# Patient Record
Sex: Female | Born: 1935 | Race: White | Hispanic: No | Marital: Married | Smoking: Never smoker
Health system: Southern US, Community
[De-identification: ages and names within clinical notes are randomized; demographics above are authoritative.]

## PROBLEM LIST (undated history)

## (undated) DIAGNOSIS — K219 Gastro-esophageal reflux disease without esophagitis: Secondary | ICD-10-CM

## (undated) DIAGNOSIS — K449 Diaphragmatic hernia without obstruction or gangrene: Secondary | ICD-10-CM

## (undated) DIAGNOSIS — S8290XA Unspecified fracture of unspecified lower leg, initial encounter for closed fracture: Secondary | ICD-10-CM

## (undated) HISTORY — PX: LEG SURGERY: SHX1003

## (undated) HISTORY — PX: OTHER SURGICAL HISTORY: SHX169

---

## 2008-03-21 ENCOUNTER — Emergency Department (HOSPITAL_COMMUNITY): Admission: EM | Admit: 2008-03-21 | Discharge: 2008-03-22 | Payer: Self-pay | Admitting: Emergency Medicine

## 2011-03-14 LAB — POCT I-STAT, CHEM 8
Chloride: 110
Creatinine, Ser: 1.3 — ABNORMAL HIGH
Glucose, Bld: 133 — ABNORMAL HIGH
HCT: 67 — ABNORMAL HIGH
Hemoglobin: 22.8 — ABNORMAL HIGH
Potassium: 4
Sodium: 143
TCO2: 20

## 2015-04-14 ENCOUNTER — Emergency Department (INDEPENDENT_AMBULATORY_CARE_PROVIDER_SITE_OTHER)
Admission: EM | Admit: 2015-04-14 | Discharge: 2015-04-14 | Disposition: A | Discharge status: Home / Self Care | Payer: Medicare Other | Source: Home / Self Care

## 2015-04-14 ENCOUNTER — Encounter (HOSPITAL_COMMUNITY): Payer: Self-pay | Admitting: *Deleted

## 2015-04-14 DIAGNOSIS — R059 Cough, unspecified: Secondary | ICD-10-CM

## 2015-04-14 DIAGNOSIS — R05 Cough: Secondary | ICD-10-CM

## 2015-04-14 HISTORY — DX: Gastro-esophageal reflux disease without esophagitis: K21.9

## 2015-04-14 NOTE — ED Notes (Signed)
Called no answer

## 2015-04-14 NOTE — ED Provider Notes (Signed)
CSN: [id number]     Arrival date & time 04/14/15  1343 History   None    Chief Complaint  Patient presents with  . URI   (Consider location/radiation/quality/duration/timing/severity/associated sxs/prior Treatment) HPI History obtained from patient:   LOCATION: chest SEVERITY:0 DURATION:this morning CONTEXT:episode of cough this morning after drinking coffee QUALITY: wet cough MODIFYING FACTORS: none, just stopped ASSOCIATED SYMPTOMS: wheezing TIMING:resolved  No past medical history on file. No past surgical history on file. No family history on file. Social History  Substance Use Topics  . Smoking status: Not on file  . Smokeless tobacco: Not on file  . Alcohol Use: Not on file   OB History    No data available     Review of Systems ROS +'ve cough  Denies: HEADACHE, NAUSEA, ABDOMINAL PAIN, CHEST PAIN, CONGESTION, DYSURIA, SHORTNESS OF BREATH  Allergies  Review of patient's allergies indicates not on file.  Home Medications   Prior to Admission medications   Not on File   Meds Ordered and Administered this Visit  Medications - No data to display  There were no vitals taken for this visit. No data found.   Physical Exam  Constitutional: She is oriented to person, place, and time. She appears well-developed and well-nourished. No distress.  HENT:  Head: Normocephalic and atraumatic.  Cardiovascular: Normal rate.   Pulmonary/Chest: Effort normal and breath sounds normal. No respiratory distress. She has no wheezes. She has no rales.  Musculoskeletal: Normal range of motion.  Neurological: She is alert and oriented to person, place, and time.  Skin: Skin is warm and dry.  Psychiatric: She has a normal mood and affect. Her behavior is normal. Judgment and thought content normal.    ED Course  Procedures (including critical care time)  Labs Review Labs Reviewed - No data to display  Imaging Review No results found.   Visual Acuity Review  Right  Eye Distance:   Left Eye Distance:   Bilateral Distance:    Right Eye Near:   Left Eye Near:    Bilateral Near:         MDM   1. Cough     No indications for investigations at this time. Treatment options discussed including: xray  Pt is stable for discharge at this time.  Continue symptomatic treatment at home. Advised to follow up with PCP or return to the clinic if there are new or worsening of symptoms.  All questions answered.  Instructions of care provided.   THIS NOTE WAS GENERATED USING A VOICE RECOGNITION SOFTWARE PROGRAM.  ALL REASONABLE EFFORTS WERE MADE TO PROOFREAD THIS DOCUMENT FOR ACCURACY.      Tharon AquasFrank C Patrick, PA 04/14/15 1604  Tharon AquasFrank C Patrick, [place] 04/15/15 [phone]

## 2015-04-14 NOTE — ED Notes (Signed)
Pt  Has  Symptoms  Of   Cough   Congested        Rattling  In  Her  Chest   With  Onset  Of  Symptoms  For  One  Day             Pt  Is  Sitting    Upright  On the   Exam table      [place]     [place]

## 2015-04-14 NOTE — Discharge Instructions (Signed)
Cough, Adult Your lungs are clear, there are no signs of infection at this time.  A cough helps to clear your throat and lungs. A cough may last only 2-3 weeks (acute), or it may last longer than 8 weeks (chronic). Many different things can cause a cough. A cough may be a sign of an illness or another medical condition. HOME CARE  Pay attention to any changes in your cough.  Take medicines only as told by your doctor.  If you were prescribed an antibiotic medicine, take it as told by your doctor. Do not stop taking it even if you start to feel better.  Talk with your doctor before you try using a cough medicine.  Drink enough fluid to keep your pee (urine) clear or pale yellow.  If the air is dry, use a cold steam vaporizer or humidifier in your home.  Stay away from things that make you cough at work or at home.  If your cough is worse at night, try using extra pillows to raise your head up higher while you sleep.  Do not smoke, and try not to be around smoke. If you need help quitting, ask your doctor.  Do not have caffeine.  Do not drink alcohol.  Rest as needed. GET HELP IF:  You have new problems (symptoms).  You cough up yellow fluid (pus).  Your cough does not get better after 2-3 weeks, or your cough gets worse.  Medicine does not help your cough and you are not sleeping well.  You have pain that gets worse or pain that is not helped with medicine.  You have a fever.  You are losing weight and you do not know why.  You have night sweats. GET HELP RIGHT AWAY IF:  You cough up blood.  You have trouble breathing.  Your heartbeat is very fast.   This information is not intended to replace advice given to you by your health care provider. Make sure you discuss any questions you have with your health care provider.   Document Released: 02/09/2011 Document Revised: 02/17/2015 Document Reviewed: 08/05/2014 Elsevier Interactive Patient Education [employer]2016 Elsevier  Inc.

## 2016-12-14 ENCOUNTER — Ambulatory Visit (HOSPITAL_COMMUNITY)
Admission: EM | Admit: 2016-12-14 | Discharge: 2016-12-14 | Disposition: A | Discharge status: Home / Self Care | Payer: Medicare Other | Attending: Family Medicine | Admitting: Family Medicine

## 2016-12-14 ENCOUNTER — Encounter (HOSPITAL_COMMUNITY): Payer: Self-pay | Admitting: Emergency Medicine

## 2016-12-14 DIAGNOSIS — M25571 Pain in right ankle and joints of right foot: Secondary | ICD-10-CM | POA: Diagnosis not present

## 2016-12-14 HISTORY — DX: Unspecified fracture of unspecified lower leg, initial encounter for closed fracture: S82.90XA

## 2016-12-14 HISTORY — DX: Diaphragmatic hernia without obstruction or gangrene: K44.9

## 2016-12-14 LAB — CBC WITH DIFFERENTIAL/PLATELET
BASOS ABS: 0 10*3/uL (ref 0.0–0.1)
Basophils Relative: 1 %
Eosinophils Absolute: 0.1 10*3/uL (ref 0.0–0.7)
Eosinophils Relative: 2 %
HEMATOCRIT: 37.5 % (ref 36.0–46.0)
HEMOGLOBIN: 11.6 g/dL — AB (ref 12.0–15.0)
LYMPHS PCT: 30 %
Lymphs Abs: 1.9 10*3/uL (ref 0.7–4.0)
MCH: 24.9 pg — ABNORMAL LOW (ref 26.0–34.0)
MCHC: 30.9 g/dL (ref 30.0–36.0)
MCV: 80.5 fL (ref 78.0–100.0)
MONO ABS: 0.5 10*3/uL (ref 0.1–1.0)
Monocytes Relative: 8 %
NEUTROS ABS: 3.6 10*3/uL (ref 1.7–7.7)
NEUTROS PCT: 59 %
Platelets: 349 10*3/uL (ref 150–400)
RBC: 4.66 MIL/uL (ref 3.87–5.11)
RDW: 20.7 % — AB (ref 11.5–15.5)
WBC: 6.1 10*3/uL (ref 4.0–10.5)

## 2016-12-14 LAB — URIC ACID: Uric Acid, Serum: 4.9 mg/dL (ref 2.3–6.6)

## 2016-12-14 LAB — C-REACTIVE PROTEIN: CRP: 1.4 mg/dL — ABNORMAL HIGH (ref <1.0)

## 2016-12-14 MED ORDER — PREDNISONE 10 MG PO TABS: 20.0000 mg | ORAL_TABLET | Freq: Every day | ORAL | 0 refills | Status: DC

## 2016-12-14 MED ORDER — CEPHALEXIN 500 MG PO CAPS: 500.0000 mg | ORAL_CAPSULE | Freq: Four times a day (QID) | ORAL | 0 refills | Status: DC

## 2016-12-14 NOTE — ED Triage Notes (Signed)
Monday morning woke with soreness, redness and swelling.  Symptoms have worsened.  No known injury.

## 2016-12-14 NOTE — ED Provider Notes (Signed)
CSN: [id number]     Arrival date & time 12/14/16  [id number] History   First MD Initiated Contact with Patient 12/14/16 1015     Chief Complaint  Patient presents with  . Ankle Pain   (Consider location/radiation/quality/duration/timing/severity/associated sxs/prior Treatment) 83 year old female with pain to the lateral aspect of the right ankle. She awoke 3 days ago and experienced discomfort. She noticed that there was some erythema followed by swelling to the lateral aspect. The initial erythema was just superior to the lateral malleolus in this area is also the most tender. Erythema has extended over the malleolus and distended to cover the lateral aspect of the ankle. There is some pain with weightbearing but she has full range of motion. Does not recollect any sort of injury, fall, blunt trauma or other. Denies recognizing any sort of sting or insect bite. No history of gout.      Past Medical History:  Diagnosis Date  . GERD (gastroesophageal reflux disease)   . Hiatal hernia   . Leg fracture    Past Surgical History:  Procedure Laterality Date  . hiatal hernia surgery    . LEG SURGERY     No family history on file. Social History  Substance Use Topics  . Smoking status: Never Smoker  . Smokeless tobacco: Not on file  . Alcohol use No   OB History    No data available     Review of Systems  Constitutional: Negative.  Negative for fever.  HENT: Negative.   Respiratory: Negative.   Gastrointestinal: Negative.   Genitourinary: Negative.   Musculoskeletal: Negative.   Skin: Positive for color change.  Neurological: Negative.   Psychiatric/Behavioral: Negative.   All other systems reviewed and are negative.   Allergies  Sulfa antibiotics  Home Medications   Prior to Admission medications   Medication Sig Start Date End Date Taking? Authorizing Provider  cephALEXin (KEFLEX) 500 MG capsule Take 1 capsule (500 mg total) by mouth 4 (four) times daily. 12/14/16   Hayden RasmussenMabe,  Ricci Dirocco, NP  predniSONE (DELTASONE) 10 MG tablet Take 2 tablets (20 mg total) by mouth daily. Take with food 12/14/16   Hayden RasmussenMabe, Leonell Lobdell, NP   Meds Ordered and Administered this Visit  Medications - No data to display  BP (!) 190/80 (BP Location: Right Arm) Comment: reported elevated BP to nurse Kim Lapan-Hutchens  Pulse 73   Temp 98.3 F (36.8 C) (Oral)   Resp 18   SpO2 96%  No data found.   Physical Exam  Constitutional: She is oriented to person, place, and time. She appears well-developed and well-nourished.  Neck: Neck supple.  Cardiovascular: Normal rate.   Pulmonary/Chest: Effort normal.  Musculoskeletal: Normal range of motion. She exhibits edema and tenderness.  Greatest amount of tenderness and slightly deeper erythema and an oval pattern just superior to the lateral malleolus. There is some swelling over the lateral malleolus and also tender to deeper palpation. Erythema extends to cover most of the lateral aspect of the ankle. The foot does not appear to be affected. Distal neurovascular motor sensory is grossly intact.  Neurological: She is alert and oriented to person, place, and time. No cranial nerve deficit.  Skin: Skin is warm.  No generalized symptoms. No rash, erythema or swelling elsewhere.  Psychiatric: She has a normal mood and affect.  Nursing note and vitals reviewed.   Urgent Care Course     Procedures (including critical care time)  Labs Review Labs Reviewed  CBC WITH DIFFERENTIAL/PLATELET  URIC  ACID  C-REACTIVE PROTEIN    Imaging Review No results found.   Visual Acuity Review  Right Eye Distance:   Left Eye Distance:   Bilateral Distance:    Right Eye Near:   Left Eye Near:    Bilateral Near:         MDM   1. Acute right ankle pain    The specific reason for the redness and tenderness in the ankle is unknown. We are drawing blood to assist in finding out what might be causing this particular pain. Possibilities include infection,  inflammation whether it be an insect bite or something from the inside, gout or something else. If the blood comes back in suggest something that you need to be treated for that the medicines given you today will not help he will be called and advised of the findings and possible change in medication. Try to keep elevated and he may want to apply some cool compresses if it is itching. If cool does not help then try heat. For any worsening, new symptoms or problems seek medical attention promptly. If the redness and pain is swelling/worsening or forming red streaks you may need to go to the emergency department.  Labs pending Meds ordered this encounter  Medications  . cephALEXin (KEFLEX) 500 MG capsule    Sig: Take 1 capsule (500 mg total) by mouth 4 (four) times daily.    Dispense:  28 capsule    Refill:  0    Order Specific Question:   Supervising Provider    Answer:   Mardella Layman [id number]  . predniSONE (DELTASONE) 10 MG tablet    Sig: Take 2 tablets (20 mg total) by mouth daily. Take with food    Dispense:  14 tablet    Refill:  0    Order Specific Question:   Supervising Provider    Answer:   Mardella Layman [[id number]]      Hayden Rasmussen, NP 12/14/16 1047

## 2016-12-14 NOTE — Discharge Instructions (Signed)
The specific reason for the redness and tenderness in the ankle is unknown. We are drawing blood to assist in finding out what might be causing this particular pain. Possibilities include infection, inflammation whether it be an insect bite or something from the inside, gout or something else. If the blood comes back in suggest something that you need to be treated for that the medicines given you today will not help he will be called and advised of the findings and possible change in medication. Try to keep elevated and he may want to apply some cool compresses if it is itching. If cool does not help then try heat. For any worsening, new symptoms or problems seek medical attention promptly. If the redness and pain is swelling/worsening or forming red streaks you may need to go to the emergency department.

## 2016-12-19 ENCOUNTER — Encounter (HOSPITAL_COMMUNITY): Payer: Self-pay | Admitting: Emergency Medicine

## 2016-12-19 ENCOUNTER — Ambulatory Visit (HOSPITAL_COMMUNITY)
Admission: EM | Admit: 2016-12-19 | Discharge: 2016-12-19 | Disposition: A | Discharge status: Home / Self Care | Payer: Medicare Other | Attending: Family Medicine | Admitting: Family Medicine

## 2016-12-19 ENCOUNTER — Ambulatory Visit (INDEPENDENT_AMBULATORY_CARE_PROVIDER_SITE_OTHER): Payer: Medicare Other

## 2016-12-19 DIAGNOSIS — S82831A Other fracture of upper and lower end of right fibula, initial encounter for closed fracture: Secondary | ICD-10-CM | POA: Diagnosis not present

## 2016-12-19 NOTE — ED Triage Notes (Signed)
The patient [facility] with a complaint of continued right ankle pain after a visit on 12/14/2016. The patient stated that she was diagnosed with gout and prescribed medication which she has taken.

## 2016-12-19 NOTE — ED Provider Notes (Addendum)
CSN: [id number]     Arrival date & time 12/19/16  1004 History   None    Chief Complaint  Patient presents with  . Follow-up   (Consider location/radiation/quality/duration/timing/severity/associated sxs/prior Treatment) Pt complains of continued ankle pain Pt has been taking prednisone and antibiotic and ankle is still swollen   The history is provided by the patient. No language interpreter was used.  Ankle Pain  Location:  Ankle Time since incident:  1 week Injury: no   Ankle location:  R ankle Pain details:    Quality:  Aching   Radiates to:  Does not radiate   Severity:  Moderate   Onset quality:  Gradual   Duration:  1 week   Timing:  Constant   Progression:  Worsening Chronicity:  New Dislocation: no   Foreign body present:  No foreign bodies Tetanus status:  Unknown Prior injury to area:  No Relieved by:  Nothing Worsened by:  Nothing Ineffective treatments:  None tried Associated symptoms: swelling   Risk factors: no concern for non-accidental trauma     Past Medical History:  Diagnosis Date  . GERD (gastroesophageal reflux disease)   . Hiatal hernia   . Leg fracture    Past Surgical History:  Procedure Laterality Date  . hiatal hernia surgery    . LEG SURGERY     History reviewed. No pertinent family history. Social History  Substance Use Topics  . Smoking status: Never Smoker  . Smokeless tobacco: Not on file  . Alcohol use No   OB History    No data available     Review of Systems  All other systems reviewed and are negative.   Allergies  Sulfa antibiotics  Home Medications   Prior to Admission medications   Medication Sig Start Date End Date Taking? Authorizing Provider  cephALEXin (KEFLEX) 500 MG capsule Take 1 capsule (500 mg total) by mouth 4 (four) times daily. 12/14/16  Yes Mabe, Onalee Huaavid, NP  predniSONE (DELTASONE) 10 MG tablet Take 2 tablets (20 mg total) by mouth daily. Take with food 12/14/16  Yes Mabe, Onalee Huaavid, NP   Meds Ordered  and Administered this Visit  Medications - No data to display  BP (!) 163/73 (BP Location: Right Arm)   Pulse (!) 59   Temp 98 F (36.7 C) (Oral)   Resp 18   SpO2 99%  No data found.   Physical Exam  Constitutional: She appears well-developed and well-nourished.  HENT:  Head: Normocephalic.  Musculoskeletal: She exhibits tenderness.  Swollen tender right ankle, pain with range of motion,  nv and ns intact   Neurological: She is alert.  Skin: Skin is warm.  Psychiatric: She has a normal mood and affect.  Nursing note and vitals reviewed.   Urgent Care Course     Procedures (including critical care time)  Labs Review Labs Reviewed - No data to display  Imaging Review Dg Ankle Complete Right  Result Date: 12/19/2016 CLINICAL DATA:  Right ankle pain. EXAM: RIGHT ANKLE - COMPLETE 3+ VIEW COMPARISON:  None. FINDINGS: Mild lateral soft tissue swelling. Cortical irregularity noted in the distal fibular metadiaphysis concerning for nondisplaced distal fibular fracture. No tibial abnormality. IMPRESSION: Concern for nondisplaced distal fibular metadiaphyseal fracture. Electronically Signed   By: Charlett NoseKevin  Dover M.D.   On: 12/19/2016 11:46     Visual Acuity Review  Right Eye Distance:   Left Eye Distance:   Bilateral Distance:    Right Eye Near:   Left Eye Near:  Bilateral Near:         MDM xray shows distal fibula fracture.   Pt counseled on results.  Pt advised to stop prednisone and antibiotic.  Pt is visiting from [facility].  Pt has a foot doctor in [place].  She will follow up for recheck in 1 week   1. Other closed fracture of distal end of right fibula, initial encounter    An After Visit Summary was printed and given to the patient.     Elson Areas, PA-C 12/19/16 1303    Elson Areas, [place] 12/19/16 1303

## 2016-12-19 NOTE — ED Notes (Signed)
Hal NeerLivia S., EMT applying cam walker

## 2020-12-02 DIAGNOSIS — M546 Pain in thoracic spine: Secondary | ICD-10-CM | POA: Insufficient documentation

## 2020-12-02 DIAGNOSIS — X501XXA Overexertion from prolonged static or awkward postures, initial encounter: Secondary | ICD-10-CM | POA: Diagnosis not present

## 2020-12-02 DIAGNOSIS — Y990 Civilian activity done for income or pay: Secondary | ICD-10-CM | POA: Diagnosis not present

## 2020-12-02 DIAGNOSIS — Y92009 Unspecified place in unspecified non-institutional (private) residence as the place of occurrence of the external cause: Secondary | ICD-10-CM | POA: Diagnosis not present

## 2020-12-02 DIAGNOSIS — Y9389 Activity, other specified: Secondary | ICD-10-CM | POA: Diagnosis not present

## 2020-12-03 ENCOUNTER — Other Ambulatory Visit: Payer: Self-pay

## 2020-12-03 ENCOUNTER — Emergency Department (HOSPITAL_COMMUNITY)
Admission: EM | Admit: 2020-12-03 | Discharge: 2020-12-03 | Disposition: A | Discharge status: Home / Self Care | Payer: Medicare Other | Attending: Emergency Medicine | Admitting: Emergency Medicine

## 2020-12-03 ENCOUNTER — Emergency Department (HOSPITAL_COMMUNITY): Payer: Medicare Other

## 2020-12-03 ENCOUNTER — Encounter (HOSPITAL_COMMUNITY): Payer: Self-pay

## 2020-12-03 DIAGNOSIS — M546 Pain in thoracic spine: Secondary | ICD-10-CM | POA: Diagnosis not present

## 2020-12-03 DIAGNOSIS — M549 Dorsalgia, unspecified: Secondary | ICD-10-CM

## 2020-12-03 NOTE — ED Provider Notes (Signed)
[facility] EMERGENCY DEPARTMENT Provider Note   CSN: [id number] Arrival date & time: 12/02/20  2319     History Chief Complaint  Patient presents with   Back Pain    Jill Wise is a 87 y.o. female.  The history is provided by the patient.  Back Pain She states that she bent over to put something in the dishwasher when she stood up, she had severe pain in her mid back.  Pain is primarily there if she tries to move.  Pain is rated at 6/10.  She denies any weakness, numbness, tingling.  Pain is similar to what she had with a broken rib in the past.  She has not taken anything for her pain.  She denies other injury.   Past Medical History:  Diagnosis Date   GERD (gastroesophageal reflux disease)    Hiatal hernia    Leg fracture     There are no problems to display for this patient.   Past Surgical History:  Procedure Laterality Date   hiatal hernia surgery     LEG SURGERY       OB History   No obstetric history on file.     History reviewed. No pertinent family history.  Social History   Tobacco Use   Smoking status: Never  Substance Use Topics   Alcohol use: No    Home Medications Prior to Admission medications   Medication Sig Start Date End Date Taking? Authorizing Provider  cephALEXin (KEFLEX) 500 MG capsule Take 1 capsule (500 mg total) by mouth 4 (four) times daily. 12/14/16   Hayden Rasmussen, NP  predniSONE (DELTASONE) 10 MG tablet Take 2 tablets (20 mg total) by mouth daily. Take with food 12/14/16   Hayden Rasmussen, NP    Allergies    Sulfa antibiotics  Review of Systems   Review of Systems  Musculoskeletal:  Positive for back pain.  All other systems reviewed and are negative.  Physical Exam Updated Vital Signs BP (!) 141/71   Pulse 62   Temp 98.7 F (37.1 C) (Oral)   Resp 18   Ht 5\' 2"  (1.575 m)   Wt 61.7 kg   SpO2 95%   BMI 24.87 kg/m   Physical Exam Vitals and nursing note reviewed.  87 year old female, resting comfortably and in no  acute distress. Vital signs are significant for borderline elevated blood pressure. Oxygen saturation is 95%, which is normal. Head is normocephalic and atraumatic. PERRLA, EOMI. Oropharynx is clear. Neck is nontender and supple without adenopathy or JVD. Back is mildly tender in the lower thoracic region in the midline.  There is no tenderness of the rib cage.  There is no CVA tenderness. Lungs are clear without rales, wheezes, or rhonchi. Chest is nontender. Heart has regular rate and rhythm without murmur. Abdomen is soft, flat, nontender without masses or hepatosplenomegaly and peristalsis is normoactive. Extremities have no cyanosis or edema, full range of motion is present. Skin is warm and dry without rash. Neurologic: Mental status is normal, cranial nerves are intact, there are no motor or sensory deficits.  ED Results / Procedures / Treatments    Radiology DG Thoracic Spine W/Swimmers  Result Date: 12/03/2020 CLINICAL DATA:  Mid back pain. Pt states mid back pain that comes around to front of chest/she states she was changing mattress pads and loading dishwasher and pain started/no hx of surgery on back EXAM: THORACIC SPINE - 3 VIEWS COMPARISON:  CT angiography chest 03/22/2008 FINDINGS: There is no  evidence of thoracic spine fracture. Multilevel degenerative changes of the spine with exaggerated kyphotic deformity. Chronic anterior wedge compression fracture of the T7 vertebral body. Otherwise alignment is normal. No other significant bone abnormalities are identified. Aortic calcification.  Bibasilar atelectasis. IMPRESSION: 1. Multilevel degenerative changes of the spine with exaggerated kyphotic deformity and a chronic T7 compression fracture. Correlate with point tenderness to evaluate for an acute component. 2. No acute displaced fracture or traumatic listhesis of the thoracic spine with limited evaluation due to overlying osseous structures and soft tissues. Electronically Signed    By: Tish Frederickson M.D.   On: 12/03/2020 06:52    Procedures Procedures   Medications Ordered in ED Medications - No data to display  ED Course  I have reviewed the triage vital signs and the nursing notes.  Pertinent imaging results that were available during my care of the patient were reviewed by me and considered in my medical decision making (see chart for details).   MDM Rules/Calculators/A&P                         Lower thoracic pain.  She will be sent for thoracic spine x-rays.  Old records are reviewed, and she has no relevant past visits.  X-rays show a chronic T7 compression fracture.  This is higher up than where her tenderness is located.  Degenerative changes are also noted.  This appears to be a pure musculoskeletal strain.  She is discharged with instructions to apply ice and use over-the-counter analgesics as needed for pain.  Return precautions discussed.  Final Clinical Impression(s) / ED Diagnoses Final diagnoses:  Back pain  Acute mid back pain    Rx / DC Orders ED Discharge Orders     None        Dione Booze, MD 12/03/20 [phone]

## 2020-12-03 NOTE — ED Notes (Signed)
Patient transported to X-ray 

## 2020-12-03 NOTE — ED Notes (Signed)
Pt returned from xray

## 2020-12-03 NOTE — ED Triage Notes (Signed)
C/o back pain after doing house work all day. Hx of same in the past.

## 2020-12-03 NOTE — ED Notes (Signed)
Pt visitor leaving, left name and contact number to be notified at time pt is discharged.  Marvelene Stoneberg (brother-in-law) CB# [phone]

## 2020-12-03 NOTE — Discharge Instructions (Addendum)
Apply ice for 30 minutes at a time, 4 times a day.  Take ibuprofen and/or acetaminophen as needed for pain.  Please be aware that combining the 2 medications gives you better relief than taking either 1 by itself.

## 2021-02-22 ENCOUNTER — Emergency Department (HOSPITAL_BASED_OUTPATIENT_CLINIC_OR_DEPARTMENT_OTHER): Payer: Medicare Other

## 2021-02-22 ENCOUNTER — Encounter (HOSPITAL_BASED_OUTPATIENT_CLINIC_OR_DEPARTMENT_OTHER): Payer: Self-pay | Admitting: Emergency Medicine

## 2021-02-22 ENCOUNTER — Emergency Department (HOSPITAL_BASED_OUTPATIENT_CLINIC_OR_DEPARTMENT_OTHER)
Admission: EM | Admit: 2021-02-22 | Discharge: 2021-02-23 | Disposition: A | Discharge status: Home / Self Care | Payer: Medicare Other | Attending: Emergency Medicine | Admitting: Emergency Medicine

## 2021-02-22 ENCOUNTER — Other Ambulatory Visit: Payer: Self-pay

## 2021-02-22 DIAGNOSIS — M546 Pain in thoracic spine: Secondary | ICD-10-CM | POA: Diagnosis present

## 2021-02-22 MED ORDER — ASPIRIN-ACETAMINOPHEN-CAFFEINE 250-250-65 MG PO TABS
2.0000 | ORAL_TABLET | Freq: Once | ORAL | Status: AC
  Administered 2021-02-22: 2 via ORAL
  Filled 2021-02-22: qty 2

## 2021-02-22 MED ORDER — LIDOCAINE 5 % EX PTCH: 1.0000 | MEDICATED_PATCH | CUTANEOUS | 0 refills | Status: AC

## 2021-02-22 NOTE — ED Provider Notes (Signed)
[facility] EMERGENCY [facility] Emergency Department Provider Note MRN:  [id number]  Arrival date & time: 02/22/21     Chief Complaint   Back Pain   History of Present Illness   Jill Wise is a 87 y.o. year-old female with no pertinent past medical history presenting to the ED with chief complaint of back pain.  Location: Thoracic back, bilateral Duration: 1 or 2 days Onset: Gradual Timing: Constant, worsening Description: Soreness or strain Severity: Mild to moderate Exacerbating/Alleviating Factors: Worse with movement or certain positions Associated Symptoms: None Pertinent Negatives: No chest pain, no shortness of breath, no flank pain, no dysuria, no hematuria, no numbness or weakness to the arms or legs  Additional History: Noticed that the pain started after bending down to change the trash bag, gradually got worse.  Has had similar back pain with similar activities in the past.  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  Review of Systems  A complete 10 system review of systems was obtained and all systems are negative except as noted in the HPI and PMH.   Patient's Health History    Past Medical History:  Diagnosis Date   GERD (gastroesophageal reflux disease)    Hiatal hernia    Leg fracture     Past Surgical History:  Procedure Laterality Date   hiatal hernia surgery     LEG SURGERY      No family history on file.  Social History   Socioeconomic History   Marital status: Married    Spouse name: Not on file   Number of children: Not on file   Years of education: Not on file   Highest education level: Not on file  Occupational History   Not on file  Tobacco Use   Smoking status: Never   Smokeless tobacco: Not on file  Substance and Sexual Activity   Alcohol use: No   Drug use: Not on file   Sexual activity: Not on file  Other Topics Concern   Not on file  Social History Narrative   Not on file   Social Determinants of Health    Financial Resource Strain: Not on file  Food Insecurity: Not on file  Transportation Needs: Not on file  Physical Activity: Not on file  Stress: Not on file  Social Connections: Not on file  Intimate Partner Violence: Not on file     Physical Exam   Vitals:   02/22/21 1926 02/22/21 2207  BP: (!) 171/89 (!) 198/86  Pulse: 63 62  Resp: 20 16  Temp: 99 F (37.2 C)   SpO2: 97% 97%    CONSTITUTIONAL: Well-appearing, NAD NEURO:  Alert and oriented x 3, normal and symmetric strength and sensation of the arms and legs EYES:  eyes equal and reactive ENT/NECK:  no LAD, no JVD CARDIO: Regular rate, well-perfused, normal S1 and S2 PULM:  CTAB no wheezing or rhonchi GI/GU:  normal bowel sounds, non-distended, non-tender MSK/SPINE:  No gross deformities, no edema SKIN:  no rash, atraumatic PSYCH:  Appropriate speech and behavior  *Additional and/or pertinent findings included in MDM below  Diagnostic and Interventional Summary    EKG Interpretation  Date/Time:    Ventricular Rate:    PR Interval:    QRS Duration:   QT Interval:    QTC Calculation:   R Axis:     Text Interpretation:         Labs Reviewed - No data to display  CT Thoracic Spine Wo Contrast  Final Result      Medications  aspirin-acetaminophen-caffeine (EXCEDRIN MIGRAINE) per tablet 2 tablet (2 tablets Oral Given 02/22/21 2326)     Procedures  /  Critical Care Procedures  ED Course and Medical Decision Making  I have reviewed the triage vital signs, the nursing notes, and pertinent available records from the EMR.  Listed above are laboratory and imaging tests that I personally ordered, reviewed, and interpreted and then considered in my medical decision making (see below for details).  Back pain, positional, favoring MSK.  Given age also considering acute compression fracture.  CT is normal.  No red flag symptoms to suggest myelopathy, reassuring neurological exam, no abdominal pain, no urinary  symptoms, appropriate for discharge.       Elmer Sow. Pilar Plate, MD [facility] Emergency Medicine [facility] [email or web address]  Final Clinical Impressions(s) / ED Diagnoses     ICD-10-CM   1. Acute bilateral thoracic back pain  M54.6       ED Discharge Orders          Ordered    lidocaine (LIDODERM) 5 %  Every 24 hours        02/22/21 2357             Discharge Instructions Discussed with and Provided to Patient:    Discharge Instructions      You were evaluated in the Emergency Department and after careful evaluation, we did not find any emergent condition requiring admission or further testing in the hospital.  Your exam/testing today was overall reassuring.  CT scan did not show any broken bones or emergencies.  Pain seems to be due to muscle strain or spasm.  Recommend Tylenol 1000 mg every 4-6 hours and/or Motrin 600 mg every 4-6 hours for pain.  You can also use the Lidoderm numbing patches as needed on the most painful area.  Use warm compresses as we discussed.  Light stretching exercises can also be helpful.  Please return to the Emergency Department if you experience any worsening of your condition.  Thank you for allowing [place] to be a part of your care.        Sabas Sous, MD 02/22/21 [phone]

## 2021-02-22 NOTE — Discharge Instructions (Addendum)
You were evaluated in the Emergency Department and after careful evaluation, we did not find any emergent condition requiring admission or further testing in the hospital.  Your exam/testing today was overall reassuring.  CT scan did not show any broken bones or emergencies.  Pain seems to be due to muscle strain or spasm.  Recommend Tylenol 1000 mg every 4-6 hours and/or Motrin 600 mg every 4-6 hours for pain.  You can also use the Lidoderm numbing patches as needed on the most painful area.  Use warm compresses as we discussed.  Light stretching exercises can also be helpful.  Please return to the Emergency Department if you experience any worsening of your condition.  Thank you for allowing [place] to be a part of your care.

## 2021-02-22 NOTE — ED Triage Notes (Signed)
Thoracic back pain since Monday. States it started hurting after she picked up the trash.

## 2022-03-05 IMAGING — CT CT T SPINE W/O CM
3 of 4 series · 13 of 33 positions shown, 15 images · non-contrast
Comparison: None.

CLINICAL DATA: Mid back pain

EXAM:
CT THORACIC SPINE WITHOUT CONTRAST
TECHNIQUE: Multidetector CT images of the thoracic were obtained using the
standard protocol without intravenous contrast.

[Series 4: t spine bone · axial · 0.43mm/px · z∈[-621,-409]mm · 5 of 160 slices shown, 7 images]
[im 27/160  soft-tissue]
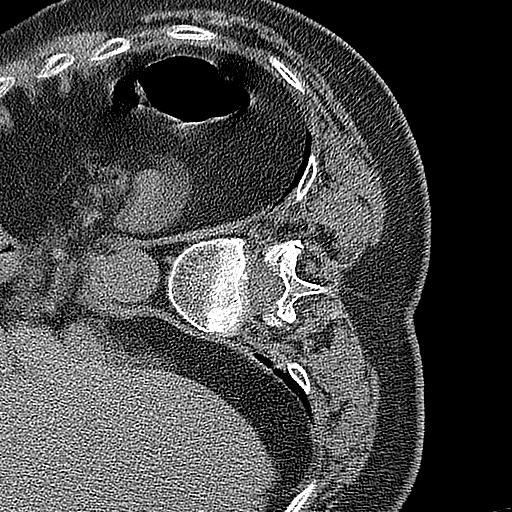
[im 27/160  bone]
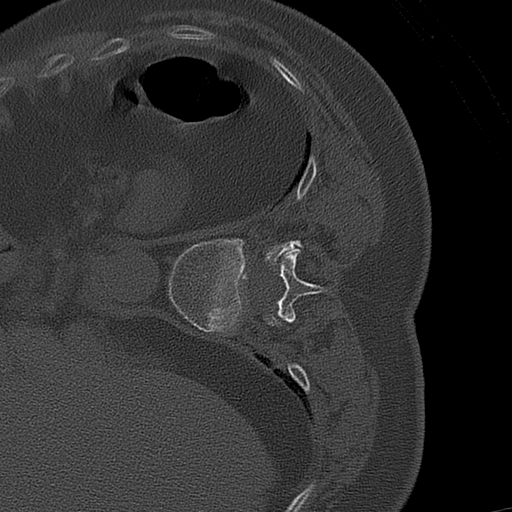
[im 54/160  bone]
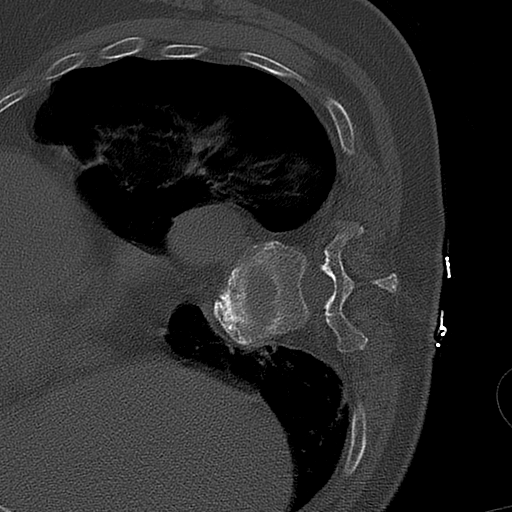
[im 80/160  bone]
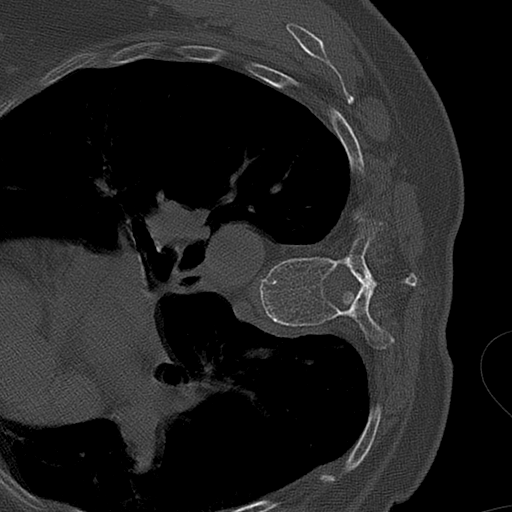
[im 107/160  bone]
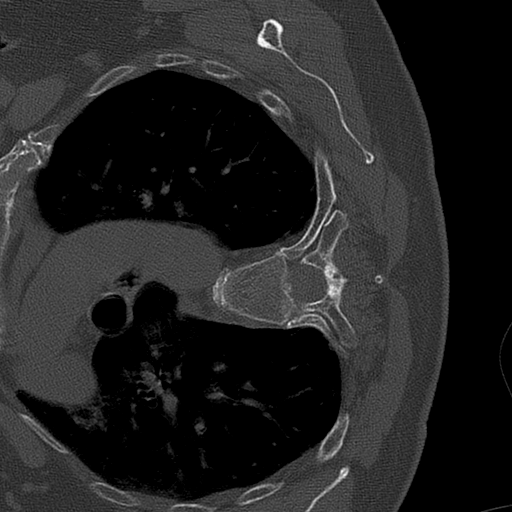
[im 133/160  soft-tissue]
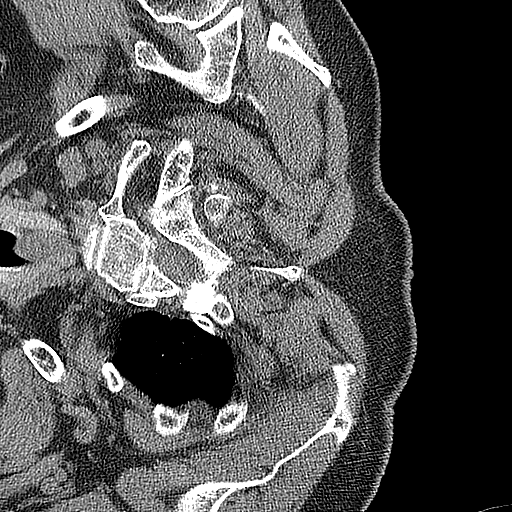
[im 133/160  bone]
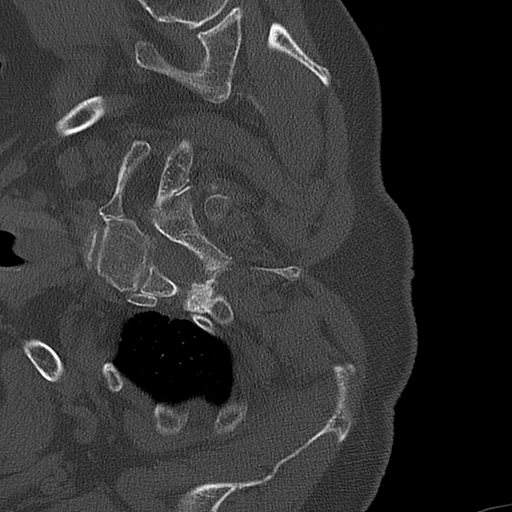

[Series 6: ax t spine soft · axial · 0.43mm/px · z∈[-621,-409]mm · 5 of 160 slices shown]
[im 27/160  soft-tissue]
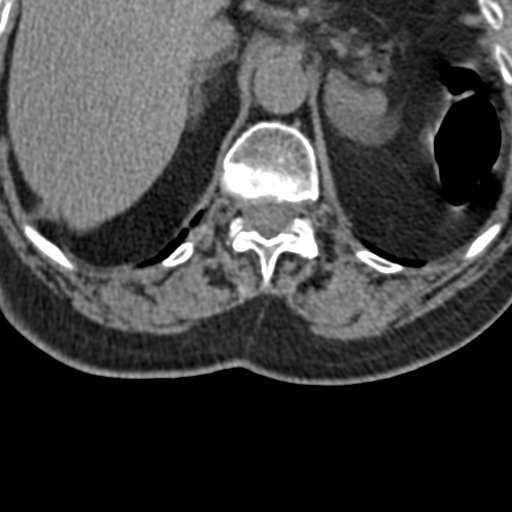
[im 54/160  soft-tissue]
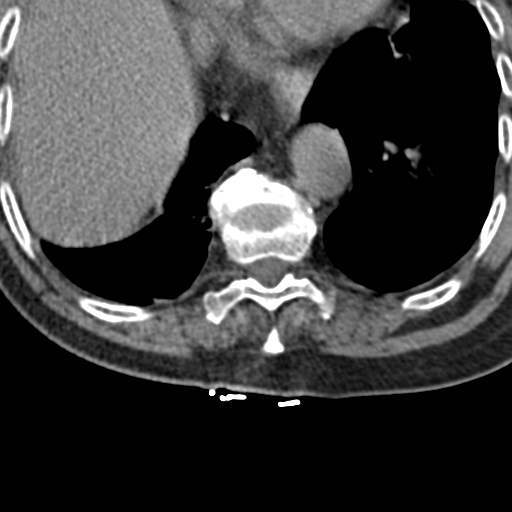
[im 80/160  soft-tissue]
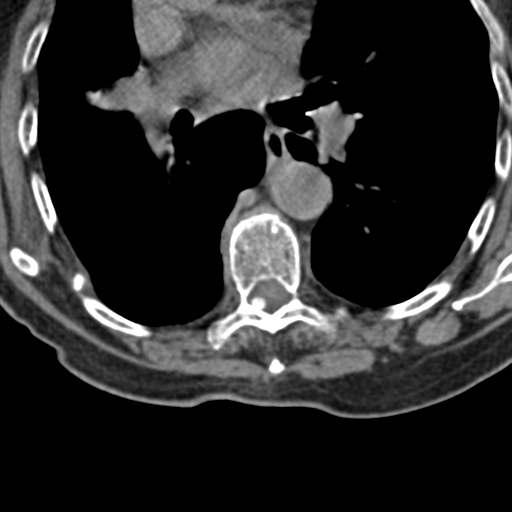
[im 107/160  soft-tissue]
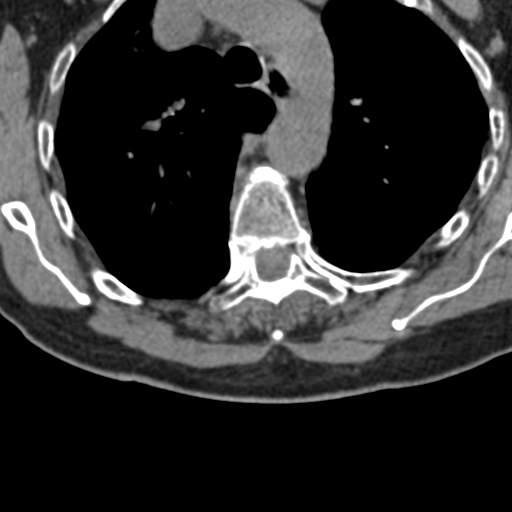
[im 133/160  soft-tissue]
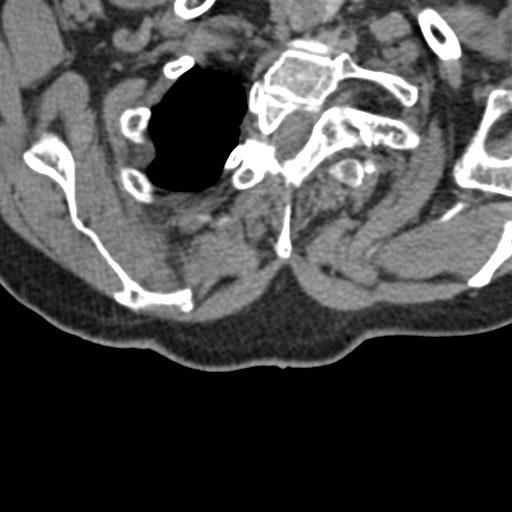

[Series 7: cor bone · coronal · 0.33mm/px · 3 of 100 slices shown]
[im 20/100  bone]
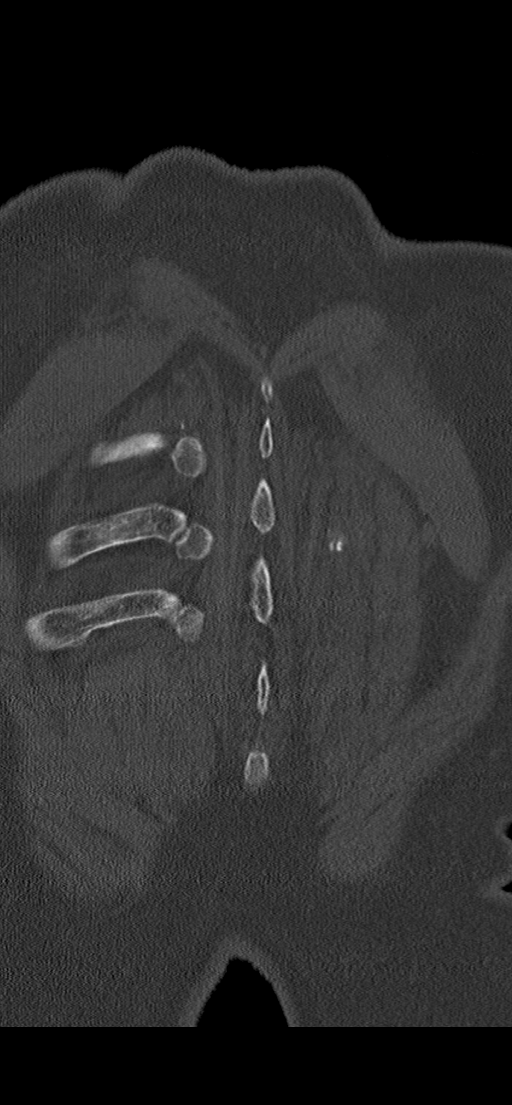
[im 40/100  bone]
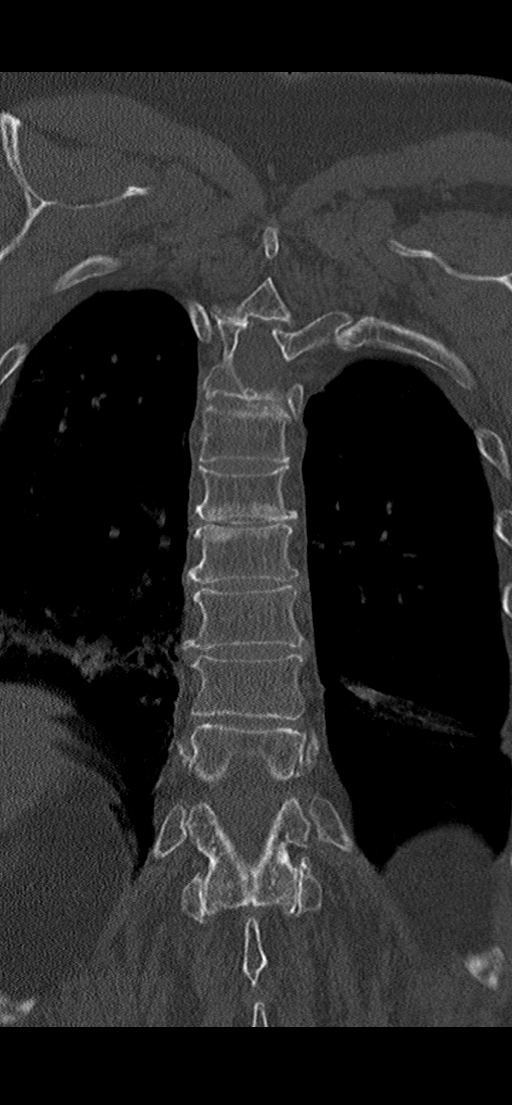
[im 60/100  bone]
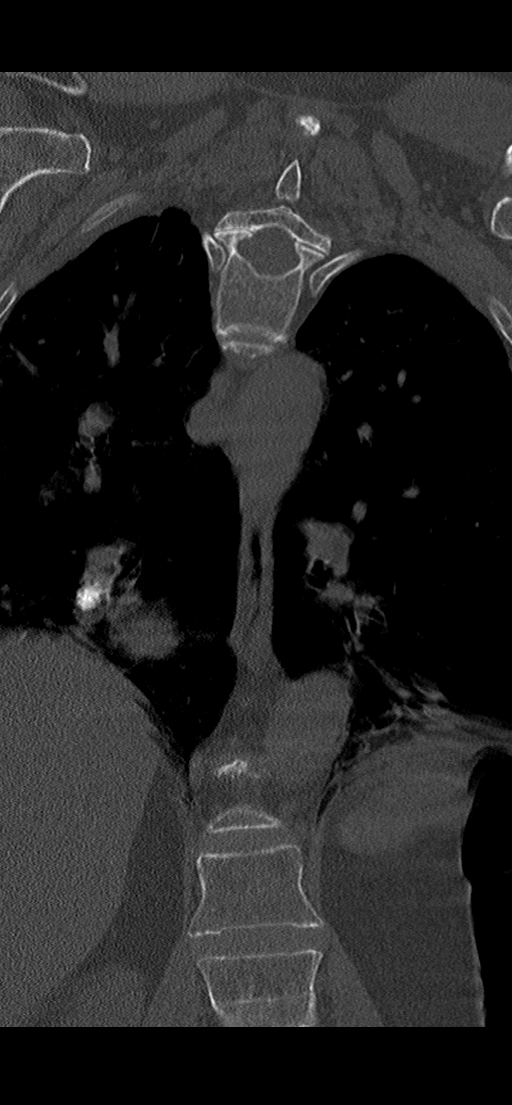

[13 of 33 positions shown; findings below may reference images not displayed]

FINDINGS: Alignment: Exaggerated kyphosis.  No static subluxation.

Vertebrae: No acute fracture. There is sclerotic endplate change at
T7-8 and T11-12.

Spinal canal: There is a calcific focus at the dorsal spinal canal
at the T8 level measuring 6 x 4 mm (series 8, image 26). No
associated spinal canal stenosis.

Paraspinal and other soft tissues: Calcific aortic atherosclerosis.
Healing fractures of the posterior right seventh and eighth ribs.

Disc levels: No spinal canal stenosis.  No nerve root impingement.
IMPRESSION: 1. No acute fracture or static subluxation of the thoracic spine.
2. Healing fractures of the posterior right seventh and eighth ribs.
3. 6 x 4 mm calcific focus at the dorsal spinal canal at the T8
level, without associated spinal canal stenosis or nerve root
impingement. This may indicate a small calcified meningioma.

Aortic Atherosclerosis ([id number]).

## 2023-10-11 ENCOUNTER — Other Ambulatory Visit: Payer: Self-pay

## 2023-10-11 ENCOUNTER — Emergency Department (HOSPITAL_COMMUNITY)

## 2023-10-11 ENCOUNTER — Encounter (HOSPITAL_COMMUNITY): Payer: Self-pay

## 2023-10-11 ENCOUNTER — Emergency Department (HOSPITAL_COMMUNITY)
Admission: EM | Admit: 2023-10-11 | Discharge: 2023-10-11 | Disposition: A | Discharge status: Home / Self Care | Attending: Emergency Medicine | Admitting: Emergency Medicine

## 2023-10-11 DIAGNOSIS — D72829 Elevated white blood cell count, unspecified: Secondary | ICD-10-CM | POA: Insufficient documentation

## 2023-10-11 DIAGNOSIS — M25552 Pain in left hip: Secondary | ICD-10-CM | POA: Diagnosis present

## 2023-10-11 LAB — COMPREHENSIVE METABOLIC PANEL WITH GFR
ALT: 25 U/L (ref 0–44)
AST: 27 U/L (ref 15–41)
Albumin: 4 g/dL (ref 3.5–5.0)
Alkaline Phosphatase: 61 U/L (ref 38–126)
Anion gap: 12 (ref 5–15)
BUN: 20 mg/dL (ref 8–23)
CO2: 22 mmol/L (ref 22–32)
Calcium: 9.9 mg/dL (ref 8.9–10.3)
Chloride: 102 mmol/L (ref 98–111)
Creatinine, Ser: 1 mg/dL (ref 0.44–1.00)
GFR, Estimated: 54 mL/min — ABNORMAL LOW (ref >60)
Glucose, Bld: 116 mg/dL — ABNORMAL HIGH (ref 70–99)
Potassium: 3.7 mmol/L (ref 3.5–5.1)
Sodium: 136 mmol/L (ref 135–145)
Total Bilirubin: 1.1 mg/dL (ref 0.0–1.2)
Total Protein: 7.8 g/dL (ref 6.5–8.1)

## 2023-10-11 LAB — CBC WITH DIFFERENTIAL/PLATELET
Abs Immature Granulocytes: 0.07 10*3/uL (ref 0.00–0.07)
Basophils Absolute: 0.1 10*3/uL (ref 0.0–0.1)
Basophils Relative: 0 %
Eosinophils Absolute: 0 10*3/uL (ref 0.0–0.5)
Eosinophils Relative: 0 %
HCT: 43.4 % (ref 36.0–46.0)
Hemoglobin: 14.8 g/dL (ref 12.0–15.0)
Immature Granulocytes: 1 %
Lymphocytes Relative: 14 %
Lymphs Abs: 1.9 10*3/uL (ref 0.7–4.0)
MCH: 31.5 pg (ref 26.0–34.0)
MCHC: 34.1 g/dL (ref 30.0–36.0)
MCV: 92.3 fL (ref 80.0–100.0)
Monocytes Absolute: 1.3 10*3/uL — ABNORMAL HIGH (ref 0.1–1.0)
Monocytes Relative: 9 %
Neutro Abs: 10.7 10*3/uL — ABNORMAL HIGH (ref 1.7–7.7)
Neutrophils Relative %: 76 %
Platelets: 286 10*3/uL (ref 150–400)
RBC: 4.7 MIL/uL (ref 3.87–5.11)
RDW: 13.8 % (ref 11.5–15.5)
WBC: 14.1 10*3/uL — ABNORMAL HIGH (ref 4.0–10.5)
nRBC: 0 % (ref 0.0–0.2)

## 2023-10-11 MED ORDER — ACETAMINOPHEN 500 MG PO TABS
1000.0000 mg | ORAL_TABLET | Freq: Once | ORAL | Status: AC
  Administered 2023-10-11: 1000 mg via ORAL
  Filled 2023-10-11: qty 2

## 2023-10-11 NOTE — ED Notes (Signed)
Walker provided to patient.

## 2023-10-11 NOTE — Discharge Planning (Signed)
 RNCM consulted regarding pt needing home health services and DME rolling walker.  RNCM spoke with pt, husband and son via telephone regarding home health establishment and DME rolling walker.  Pt and family refused home health services, but agreed to DME rolling walker.  TOC provided rolling walker at bedside prior to pt discharge home.  RNCM advised that should she change her mind regarding home health services, she may contact her PCP to arrange from the community.  No further TOC needs identified at this time.  Manjinder Breau J. Rachel Budds, RN, BSN, NCM  Transitions of Care  Nurse Case Manager  [facility] Emergency Departments  Operative Services  [phone]

## 2023-10-11 NOTE — Discharge Instructions (Addendum)
 You were seen in the ED today for left hip pain.  There were no emergency findings to your symptoms.  Please follow-up with your PCP about your CT scan and for reevaluation with the next few days.  Please return the ED if you experience any emergency medical symptoms noted in your paperwork.  You can take Tylenol  and ibuprofen as needed for pain.  1. 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  2. Prominent central narrowing of the thecal sac at L4-5 due to disc  uncovering, disc bulge, facet arthropathy, and ligamentum flavum  redundancy.  3. Possible bilateral subarticular lateral recess stenosis at L4-5  at L5-S1 due to spondylosis and degenerative disc disease.  4. 5 mm degenerative anterolisthesis at L4-5 with 2 mm of  anterolisthesis at L5-S1.  5. 30% chronic compression fracture at S1 with about 4 mm of chronic  posterior bony retropulsion.  6. Fused facet joints at L5-S1.

## 2023-10-11 NOTE — ED Provider Notes (Signed)
 [facility] EMERGENCY DEPARTMENT AT [facility] Provider Note   CSN: [id number] Arrival date & time: 10/11/23  1451     History  Chief Complaint  Patient presents with   Hip Pain   HPI  Jill Wise is a 89 y.o. female with past medical history of pretension and GERD as well as left rod presents presents due to left-sided hip pain.  Patient states that she woke up this morning with increased pain to her left hip when ambulating.  She denies any falls.  Denies any fevers.  She is able to ambulate with a walker, with pain elicited to the left hip.  This has not happened to her in the past.  She denies any bowel or bladder incontinence or retention, denies any saddle anesthesia, denies any weakness to her lower extremities.  She has not taken any medications for the pain.   Hip Pain       Home Medications Prior to Admission medications   Medication Sig Start Date End Date Taking? Authorizing Provider  donepezil (ARICEPT) 5 MG tablet Take 5 mg by mouth at bedtime. 07/18/23  Yes [provider]  lisinopril (ZESTRIL) 5 MG tablet Take by mouth. 08/31/23  Yes [provider]  memantine (NAMENDA XR) 28 MG CP24 24 hr capsule Take 1 capsule by mouth every morning. 11/30/21  Yes [provider]  atorvastatin (LIPITOR) 20 MG tablet     [provider]  hydrochlorothiazide (MICROZIDE) 12.5 MG capsule 12.5 MG  , TAKE 1 CAPSULE BY MOUTH EVERY DAY IN THE MORNING    [provider]  lidocaine  (LIDODERM ) 5 % Place 1 patch onto the skin daily. Remove & Discard patch within 12 hours or as directed by MD 02/22/21   Edson Graces, MD      Allergies    Sulfa antibiotics    Review of Systems   Review of Systems  Physical Exam Updated Vital Signs BP 137/77   Pulse 65   Temp 99.2 F (37.3 C) (Oral)   Resp 15   Ht 5\' 2"  (1.575 m)   Wt 62.6 kg   SpO2 93%   BMI 25.24 kg/m  Physical Exam Vitals and nursing note reviewed.  Constitutional:       General: She is not in acute distress.    Appearance: She is well-developed.  HENT:     Head: Normocephalic and atraumatic.     Right Ear: External ear normal.     Left Ear: External ear normal.     Nose: Nose normal.     Mouth/Throat:     Mouth: Mucous membranes are moist.  Eyes:     Conjunctiva/sclera: Conjunctivae normal.  Cardiovascular:     Rate and Rhythm: Normal rate and regular rhythm.     Pulses:          Dorsalis pedis pulses are 2+ on the right side and 2+ on the left side.       Posterior tibial pulses are 2+ on the right side and 2+ on the left side.  Pulmonary:     Effort: Pulmonary effort is normal.  Abdominal:     General: There is no distension.     Palpations: Abdomen is soft.     Tenderness: There is no abdominal tenderness. There is no guarding or rebound.  Musculoskeletal:     Cervical back: Neck supple.     Comments: No T or L-spine tenderness, no step-offs Reproducible pain with straight leg test, endorses  radiation down the left leg  Skin:    General: Skin is warm and dry.     Capillary Refill: Capillary refill takes less than 2 seconds.     Comments: No overlying erythema, swelling, or skin changes to the left hip/left upper glute  Neurological:     General: No focal deficit present.     Mental Status: She is alert.  Psychiatric:        Mood and Affect: Mood normal.     ED Results / Procedures / Treatments   Labs (all labs ordered are listed, but only abnormal results are displayed) Labs Reviewed  CBC WITH DIFFERENTIAL/PLATELET - Abnormal; Notable for the following components:      Result Value   WBC 14.1 (*)    Neutro Abs 10.7 (*)    Monocytes Absolute 1.3 (*)    All other components within normal limits  COMPREHENSIVE METABOLIC PANEL WITH GFR - Abnormal; Notable for the following components:   Glucose, Bld 116 (*)    GFR, Estimated 54 (*)    All other components within normal limits    EKG None  Radiology CT Lumbar Spine Wo  Contrast Result Date: 10/11/2023 CLINICAL DATA:  Left hip pain EXAM: CT LUMBAR SPINE WITHOUT CONTRAST TECHNIQUE: Multidetector CT imaging of the lumbar spine was performed without intravenous contrast administration. Multiplanar CT image reconstructions were also generated. RADIATION DOSE REDUCTION: This exam was performed according to the departmental dose-optimization program which includes automated exposure control, adjustment of the mA and/or kV according to patient size and/or use of iterative reconstruction technique. COMPARISON:  None Available. FINDINGS: Segmentation: Transitional S1 vertebra observed with broad transverse processes which articulate with the rest of the sacrum. Alignment: 2 mm anterolisthesis of L5 on S1, with the facet joints fused at this level. 5 mm of degenerative anterolisthesis at L4-5. Vertebrae: 30% loss of height of the S1 vertebra likely attributable to old fracture, with 4 mm of suspected posterior bony retropulsion along the posterosuperior endplate. Fused facet joints at L5-S1. Calcification within along the intervertebral discs at L5-S1 and S1-2. Paraspinal and other soft tissues: Abdominal aortic atherosclerosis. Disc levels: L1-2: Unremarkable L2-3: Unremarkable L3-4: No impingement. Disc bulge noted. Mild degenerative facet arthropathy. L4-5: Prominent central narrowing of the thecal sac due to disc uncovering, disc bulge, facet arthropathy, ligamentum flavum redundancy. Possible bilateral subarticular lateral recess stenosis. L5-S1: Borderline central narrowing of the thecal sac related to the bony retropulsion from the posterosuperior endplate of S1. In addition I suspect left greater than right subarticular lateral recess stenosis. S1-2: No impingement. IMPRESSION: 1. 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 2. Prominent central narrowing of the thecal sac at L4-5 due to disc uncovering, disc bulge, facet  arthropathy, and ligamentum flavum redundancy. 3. Possible bilateral subarticular lateral recess stenosis at L4-5 at L5-S1 due to spondylosis and degenerative disc disease. 4. 5 mm degenerative anterolisthesis at L4-5 with 2 mm of anterolisthesis at L5-S1. 5. 30% chronic compression fracture at S1 with about 4 mm of chronic posterior bony retropulsion. 6. Fused facet joints at L5-S1. 7.  Aortic Atherosclerosis (ICD10-I70.0). Electronically Signed   By: Freida Jes M.D.   On: 10/11/2023 19:24   CT PELVIS WO CONTRAST Result Date: 10/11/2023 CLINICAL DATA:  Left hip pain EXAM: CT PELVIS WITHOUT CONTRAST TECHNIQUE: Multidetector CT imaging of the pelvis was performed following the standard protocol without intravenous contrast. RADIATION DOSE REDUCTION: This exam was performed according to the departmental  dose-optimization program which includes automated exposure control, adjustment of the mA and/or kV according to patient size and/or use of iterative reconstruction technique. COMPARISON:  Radiographs 10/11/2023 FINDINGS: Osseous structures and joints: Lumbar spine assessment deferred to the dedicated lumbar spine CT report. Left proximal femoral nail system noted, no abnormal lucency along the visualized portion of the nail system. The lag screw abuts the anterior endosteal margin of the femoral neck without extension through the cortex. As noted previously there is prominent spurring of the lesser trochanter along the distal iliopsoas tendon. SI joints unremarkable. No pelvic fracture. Mild sclerosis along the pubis, probably degenerative. No findings of hip effusion Musculotendinous: Atrophic left gluteus minimus muscle. Sacral plexus: No impinging lesion identified along the sacral plexus or sciatic notch region. The proximal sciatic nerves appear symmetric. Other: Atherosclerosis is present, including aortoiliac atherosclerotic disease. No pathologic adenopathy. 1.6 cm calcified uterine fibroid eccentric  to the right. Fluid density 2.9 by 2.4 cm left ovarian cyst and fluid density 1.9 by 1.8 cm right ovarian cyst. No follow-up imaging recommended. IMPRESSION: 1. Left proximal femoral nail system, without abnormal lucency along the visualized portion of the nail system. The lag screw abuts the anterior endosteal margin of the femoral neck without extension through the cortex. 2. Prominent spurring of the lesser trochanter along the distal iliopsoas tendon. 3. Atrophic left gluteus minimus muscle. 4.  Aortic Atherosclerosis (ICD10-I70.0). 5. Calcified uterine fibroid. Electronically Signed   By: Freida Jes M.D.   On: 10/11/2023 19:16   DG Pelvis 1-2 Views Result Date: 10/11/2023 CLINICAL DATA:  Left hip pain EXAM: PELVIS - 1-2 VIEW COMPARISON:  None Available. FINDINGS: Left femoral nail system with lag screw noted along with deformity in the left inter trochanteric region compatible with healed fracture. The lesser trochanter appears to have healed in with a spur like prominence extending towards the lower acetabulum. No acute bony findings in this region or elsewhere in the pelvis. Clustered 1.9 cm calcification eccentric to the right in the anatomic pelvis, probably from a uterine fibroid. Bony demineralization.  Lower lumbar spondylosis. IMPRESSION: 1. No acute findings. 2. Left femoral nail system with lag screw. 3. Healed fracture of the left intertrochanteric region. Spur like prominence of the lesser trochanter extends towards the inferior acetabulum without bony contact. 4. Lower lumbar spondylosis. 5. Bony demineralization. 6. Clustered 1.9 cm calcification eccentric to the right in the anatomic pelvis, probably from a uterine fibroid. Electronically Signed   By: Freida Jes M.D.   On: 10/11/2023 16:59    Procedures Procedures    Medications Ordered in ED Medications  acetaminophen  (TYLENOL ) tablet 1,000 mg (1,000 mg Oral Given 10/11/23 1652)    ED Course/ Medical Decision Making/  A&P                                 Medical Decision Making Amount and/or Complexity of Data Reviewed Radiology: ordered.  Risk OTC drugs.   Patient is alert, afebrile, hemodynamically stable in no acute distress.  Physical exam as noted above.  Differential includes MSK pain, occult fracture, sciatica, amongst other diagnoses.  Physical exam not consistent with cellulitis or VZV, no hip swelling or erythema to suggest septic arthritis.  I considered ureterolithiasis, but patient does not have left CVA tenderness, and pain is reproducible with left hip flexion.  Labs obtained through triage demonstrate slight leukocytosis of 14.1, CMP with no severe electrolyte derangement, no AKI, no transaminitis.  I personally interpreted patient's pelvic x-ray, which demonstrated no acute fractures or malalignments.  Will obtain CT imaging of the pelvis and lumbar spine.  CT imaging of the pelvis with no acute findings, no abnormal lucency noted to the left hip pin. CT imaging of the lumbar spine with narrowing at L4-L5 noted to be multifactorial from disc bulge, facet arthropathy, and ligamentum flavum redundancy. Lateral recess stenosis at L4-L5 and L5-S1, as well as degenerative changes and chronic compression fracture.  Overall, patient's osteoarthritis versus sciatica may be responsible for her symptoms today.  She attempted to ambulate with her cane, and moving from sitting to standing reproduced her pain, but ambulation was stable with intermittent assistance.  She prefers walker for safety given her pain, which I agree with, but does not currently have one at her house.  Social work assisted in obtaining the patient a walker.  I discussed with her, her husband, and her son the need to follow-up with her PCP within the next week for reevaluation, sooner if symptoms worsen as well specific ED return precautions.  They are agreeable to this plan. Patient was discharged in stable condition.  Patient seen in  conjunction with Dr. Isaiah Marc, who agreed with the above work-up and plan of care.        Final Clinical Impression(s) / ED Diagnoses Final diagnoses:  Left hip pain    Rx / DC Orders ED Discharge Orders     None         Lorain Robson, MD 10/11/23 2145    Mordecai Applebaum, MD 10/12/23 1223

## 2023-10-11 NOTE — ED Provider Triage Note (Signed)
 Emergency Medicine Provider Triage Evaluation Note  Jill Wise , Wise 89 y.o. female  was evaluated in triage.  Pt complains of hip pain. She reports that she woke up this morning with left-sided hip pain.  Denies any recent injury, trauma, or falls.  No history of similar symptoms.  Denies any radiating pain into the left lower extremity.  No saddle paresthesia, bowel or bladder incontinence.  She also states that she feels like someone put "leather soles, on bilateral feet.  Review of Systems  Positive: As above Negative: As above  Physical Exam  BP (!) 170/86 (BP Location: Right Arm)   Pulse 75   Temp 99.2 F (37.3 C) (Oral)   Resp 15   Ht 5\' 2"  (1.575 m)   Wt 62.6 kg   SpO2 96%   BMI 25.24 kg/m  Gen:   Awake, no distress   Resp:  Normal effort  MSK:   Tenderness to palpation deep into the left gluteus but no obvious bony tenderness overlying the left hip, or midline low back. Other:  Palpable 2+ DP pulses on bilateral feet  Medical Decision Making  Medically screening exam initiated at 3:39 PM.  Appropriate orders placed.  Jill Wise was informed that the remainder of the evaluation will be completed by another provider, this initial triage assessment does not replace that evaluation, and the importance of remaining in the ED until their evaluation is complete.     Jill Wise A, PA-C 10/11/23 1541

## 2023-10-11 NOTE — ED Triage Notes (Signed)
 Pt here for left sided hip pain since yesterday. Pt states unable to walk today. No obvious deformity. Denies falls/traumas. Axox4. Hx of arthritis.
